# Patient Record
Sex: Male | Born: 1995 | Race: White | Hispanic: No | Marital: Single | Smoking: Never smoker
Health system: Southern US, Community
[De-identification: ages and names within clinical notes are randomized; demographics above are authoritative.]

---
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
## 2018-03-29 ENCOUNTER — Other Ambulatory Visit: Payer: Self-pay

## 2018-03-29 ENCOUNTER — Emergency Department (HOSPITAL_BASED_OUTPATIENT_CLINIC_OR_DEPARTMENT_OTHER)
Admission: EM | Admit: 2018-03-29 | Discharge: 2018-03-29 | Disposition: A | Discharge status: Home / Self Care | Payer: 59 | Attending: Emergency Medicine | Admitting: Emergency Medicine

## 2018-03-29 ENCOUNTER — Encounter (HOSPITAL_BASED_OUTPATIENT_CLINIC_OR_DEPARTMENT_OTHER): Payer: Self-pay

## 2018-03-29 DIAGNOSIS — Z91010 Allergy to peanuts: Secondary | ICD-10-CM | POA: Insufficient documentation

## 2018-03-29 DIAGNOSIS — S91012A Laceration without foreign body, left ankle, initial encounter: Secondary | ICD-10-CM | POA: Diagnosis present

## 2018-03-29 DIAGNOSIS — Z23 Encounter for immunization: Secondary | ICD-10-CM | POA: Insufficient documentation

## 2018-03-29 DIAGNOSIS — Y9389 Activity, other specified: Secondary | ICD-10-CM | POA: Diagnosis not present

## 2018-03-29 DIAGNOSIS — W268XXA Contact with other sharp object(s), not elsewhere classified, initial encounter: Secondary | ICD-10-CM | POA: Diagnosis not present

## 2018-03-29 DIAGNOSIS — Y999 Unspecified external cause status: Secondary | ICD-10-CM | POA: Diagnosis not present

## 2018-03-29 DIAGNOSIS — Y929 Unspecified place or not applicable: Secondary | ICD-10-CM | POA: Insufficient documentation

## 2018-03-29 MED ORDER — CLINDAMYCIN HCL 150 MG PO CAPS: 300.0000 mg | ORAL_CAPSULE | Freq: Four times a day (QID) | ORAL | 0 refills | Status: DC

## 2018-03-29 MED ORDER — LIDOCAINE-EPINEPHRINE (PF) 2 %-1:200000 IJ SOLN
10.0000 mL | Freq: Once | INTRAMUSCULAR | Status: AC
  Administered 2018-03-29: 10 mL
  Filled 2018-03-29 (×2): qty 10

## 2018-03-29 MED ORDER — TETANUS-DIPHTH-ACELL PERTUSSIS 5-2.5-18.5 LF-MCG/0.5 IM SUSP
0.5000 mL | Freq: Once | INTRAMUSCULAR | Status: AC
  Administered 2018-03-29: 0.5 mL via INTRAMUSCULAR
  Filled 2018-03-29: qty 0.5

## 2018-03-29 NOTE — Discharge Instructions (Signed)
Please read and follow all provided instructions.  Your diagnoses today include:  1. Laceration of left ankle, initial encounter     Tests performed today include:  Vital signs. See below for your results today.   Medications prescribed:   Clindamycin - antibiotic  You have been prescribed an antibiotic medicine: take the entire course of medicine even if you are feeling better. Stopping early can cause the antibiotic not to work.  Take any prescribed medications only as directed.   Home care instructions:  Follow any educational materials and wound care instructions contained in this packet.   Keep affected area above the level of your heart when possible to minimize swelling. Wash area gently twice a day with warm soapy water. Do not apply alcohol or hydrogen peroxide. Cover the area if it draining or weeping.   Follow-up instructions: Suture Removal: Return to the Emergency Department or see your primary care care doctor in 10 days for a recheck of your wound and removal of your sutures or staples.    Return instructions:  Return to the Emergency Department if you have:  Fever  Worsening pain  Worsening swelling of the wound  Pus draining from the wound  Redness of the skin that moves away from the wound, especially if it streaks away from the affected area   Any other emergent concerns  Your vital signs today were: BP 102/68 (BP Location: Right Arm)    Pulse 67    Temp 98.6 F (37 C) (Oral)    Resp 18    Ht 5\' 9"  (1.753 m)    Wt 68 kg (150 lb)    SpO2 99%    BMI 22.15 kg/m  If your blood pressure (BP) was elevated above 135/85 this visit, please have this repeated by your doctor within one month. --------------
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
## 2021-10-12 ENCOUNTER — Emergency Department (HOSPITAL_COMMUNITY): Payer: 59

## 2021-10-12 ENCOUNTER — Emergency Department (HOSPITAL_COMMUNITY)
Admission: EM | Admit: 2021-10-12 | Discharge: 2021-10-12 | Disposition: A | Discharge status: Home / Self Care | Payer: 59 | Attending: Emergency Medicine | Admitting: Emergency Medicine

## 2021-10-12 ENCOUNTER — Encounter (HOSPITAL_COMMUNITY): Payer: Self-pay

## 2021-10-12 ENCOUNTER — Other Ambulatory Visit: Payer: Self-pay

## 2021-10-12 DIAGNOSIS — R1084 Generalized abdominal pain: Secondary | ICD-10-CM | POA: Diagnosis not present

## 2021-10-12 DIAGNOSIS — Z91010 Allergy to peanuts: Secondary | ICD-10-CM | POA: Insufficient documentation

## 2021-10-12 DIAGNOSIS — Z20822 Contact with and (suspected) exposure to covid-19: Secondary | ICD-10-CM | POA: Insufficient documentation

## 2021-10-12 DIAGNOSIS — R112 Nausea with vomiting, unspecified: Secondary | ICD-10-CM | POA: Diagnosis not present

## 2021-10-12 DIAGNOSIS — R109 Unspecified abdominal pain: Secondary | ICD-10-CM | POA: Diagnosis present

## 2021-10-12 DIAGNOSIS — R630 Anorexia: Secondary | ICD-10-CM | POA: Diagnosis not present

## 2021-10-12 LAB — URINALYSIS, ROUTINE W REFLEX MICROSCOPIC
Bilirubin Urine: NEGATIVE
Glucose, UA: NEGATIVE mg/dL
Hgb urine dipstick: NEGATIVE
Ketones, ur: NEGATIVE mg/dL
Leukocytes,Ua: NEGATIVE
Nitrite: NEGATIVE
Protein, ur: NEGATIVE mg/dL
Specific Gravity, Urine: <1.005 — ABNORMAL LOW (ref 1.005–1.030)
pH: 6 (ref 5.0–8.0)

## 2021-10-12 LAB — CBC WITH DIFFERENTIAL/PLATELET
Abs Immature Granulocytes: 0.01 10*3/uL (ref 0.00–0.07)
Basophils Absolute: 0.1 10*3/uL (ref 0.0–0.1)
Basophils Relative: 1 %
Eosinophils Absolute: 0.7 10*3/uL — ABNORMAL HIGH (ref 0.0–0.5)
Eosinophils Relative: 9 %
HCT: 43.2 % (ref 39.0–52.0)
Hemoglobin: 15.7 g/dL (ref 13.0–17.0)
Immature Granulocytes: 0 %
Lymphocytes Relative: 29 %
Lymphs Abs: 2.3 10*3/uL (ref 0.7–4.0)
MCH: 31.7 pg (ref 26.0–34.0)
MCHC: 36.3 g/dL — ABNORMAL HIGH (ref 30.0–36.0)
MCV: 87.3 fL (ref 80.0–100.0)
Monocytes Absolute: 0.5 10*3/uL (ref 0.1–1.0)
Monocytes Relative: 6 %
Neutro Abs: 4.2 10*3/uL (ref 1.7–7.7)
Neutrophils Relative %: 55 %
Platelets: 296 10*3/uL (ref 150–400)
RBC: 4.95 MIL/uL (ref 4.22–5.81)
RDW: 11.9 % (ref 11.5–15.5)
WBC: 7.8 10*3/uL (ref 4.0–10.5)
nRBC: 0 % (ref 0.0–0.2)

## 2021-10-12 LAB — COMPREHENSIVE METABOLIC PANEL
ALT: 15 U/L (ref 0–44)
AST: 20 U/L (ref 15–41)
Albumin: 4.5 g/dL (ref 3.5–5.0)
Alkaline Phosphatase: 47 U/L (ref 38–126)
Anion gap: 7 (ref 5–15)
BUN: 12 mg/dL (ref 6–20)
CO2: 26 mmol/L (ref 22–32)
Calcium: 9.6 mg/dL (ref 8.9–10.3)
Chloride: 106 mmol/L (ref 98–111)
Creatinine, Ser: 1.12 mg/dL (ref 0.61–1.24)
GFR, Estimated: >60 mL/min (ref >60)
Glucose, Bld: 95 mg/dL (ref 70–99)
Potassium: 3.6 mmol/L (ref 3.5–5.1)
Sodium: 139 mmol/L (ref 135–145)
Total Bilirubin: 0.5 mg/dL (ref 0.3–1.2)
Total Protein: 7.4 g/dL (ref 6.5–8.1)

## 2021-10-12 LAB — RESP PANEL BY RT-PCR (FLU A&B, COVID) ARPGX2
Influenza A by PCR: NEGATIVE
Influenza B by PCR: NEGATIVE
SARS Coronavirus 2 by RT PCR: NEGATIVE

## 2021-10-12 LAB — LIPASE, BLOOD: Lipase: 32 U/L (ref 11–51)

## 2021-10-12 MED ORDER — IOHEXOL 300 MG/ML  SOLN
100.0000 mL | Freq: Once | INTRAMUSCULAR | Status: AC | PRN
  Administered 2021-10-12: 100 mL via INTRAVENOUS

## 2021-10-12 MED ORDER — FENTANYL CITRATE PF 50 MCG/ML IJ SOSY
50.0000 ug | PREFILLED_SYRINGE | Freq: Once | INTRAMUSCULAR | Status: AC
  Administered 2021-10-12: 50 ug via INTRAVENOUS
  Filled 2021-10-12: qty 1

## 2021-10-12 MED ORDER — SODIUM CHLORIDE (PF) 0.9 % IJ SOLN
INTRAMUSCULAR | Status: AC
  Filled 2021-10-12: qty 50

## 2021-10-12 MED ORDER — ONDANSETRON 4 MG PO TBDP: 4.0000 mg | ORAL_TABLET | Freq: Three times a day (TID) | ORAL | 0 refills | Status: DC | PRN

## 2021-10-12 MED ORDER — SODIUM CHLORIDE 0.9 % IV BOLUS
1000.0000 mL | Freq: Once | INTRAVENOUS | Status: AC
  Administered 2021-10-12: 1000 mL via INTRAVENOUS

## 2021-10-12 MED ORDER — ONDANSETRON HCL 4 MG/2ML IJ SOLN
4.0000 mg | Freq: Once | INTRAMUSCULAR | Status: AC
  Administered 2021-10-12: 4 mg via INTRAVENOUS
  Filled 2021-10-12: qty 2

## 2021-10-12 MED ORDER — ONDANSETRON 4 MG PO TBDP: 4.0000 mg | ORAL_TABLET | Freq: Three times a day (TID) | ORAL | 0 refills | Status: AC | PRN

## 2021-10-12 MED ORDER — DICYCLOMINE HCL 20 MG PO TABS: 20.0000 mg | ORAL_TABLET | Freq: Two times a day (BID) | ORAL | 0 refills | Status: DC

## 2021-10-12 MED ORDER — DICYCLOMINE HCL 20 MG PO TABS: 20.0000 mg | ORAL_TABLET | Freq: Two times a day (BID) | ORAL | 0 refills | Status: AC

## 2021-10-12 NOTE — ED Notes (Signed)
Pt aware of need for urine specimen. 

## 2021-10-12 NOTE — Discharge Instructions (Signed)
As discussed, your evaluation today has been largely reassuring.  But, it is important that you monitor your condition carefully, and do not hesitate to return to the ED if you develop new, or concerning changes in your condition. ? ?Otherwise, please follow-up with your physician for appropriate ongoing care. ? ?

## 2021-10-12 NOTE — ED Triage Notes (Signed)
Patient c/o severe abdominal pain since Saturday. Patient reports nausea and vomiting since Saturday. Patient has not taking medications to help with pain. Patient denies fever, headaches, and chills.

## 2021-10-12 NOTE — ED Provider Notes (Signed)
[facility] DEPT Provider Note   CSN: [id number] Arrival date & time: 10/12/21  0932     History  No chief complaint on file.   Christian Dunn is a 26 y.o. male.  HPI Patient is previously well young male presenting with his father who assists with the history.  He presents with 5 days of intermittent abdominal pain, currently the most severe.  Pain is left-sided, inferior, superior radiation, none on the right side.  There is associated nausea, vomiting, anorexia.  Last bowel movement was yesterday, unremarkable.  No history of abdominal surgery.     Home Medications Prior to Admission medications   Medication Sig Start Date End Date Taking? Authorizing Provider  clindamycin (CLEOCIN) 150 MG capsule Take 2 capsules (300 mg total) by mouth every 6 (six) hours. 03/29/18   Carlisle Cater, PA-C  dicyclomine (BENTYL) 20 MG tablet Take 1 tablet (20 mg total) by mouth 2 (two) times daily. 10/12/21   Carmin Muskrat, MD  ibuprofen (ADVIL,MOTRIN) 200 MG tablet Take 200 mg by mouth every 6 (six) hours as needed. For pain    [provider]  ondansetron (ZOFRAN-ODT) 4 MG disintegrating tablet Take 1 tablet (4 mg total) by mouth every 8 (eight) hours as needed for nausea or vomiting. 10/12/21   Carmin Muskrat, MD  Pediatric Multiple Vit-C-FA (PEDIATRIC MULTIVITAMIN) chewable tablet Chew 1 tablet by mouth daily.    [provider]      Allergies    Peanut-containing drug products and Penicillins cross reactors    Review of Systems   Review of Systems  Constitutional:        Per HPI, otherwise negative  HENT:         Per HPI, otherwise negative  Respiratory:         Per HPI, otherwise negative  Cardiovascular:        Per HPI, otherwise negative  Gastrointestinal:  Positive for abdominal pain, nausea and vomiting.  Endocrine:       Negative aside from HPI  Genitourinary:        Neg aside from HPI   Musculoskeletal:        Per HPI,  otherwise negative  Skin: Negative.   Neurological:  Negative for syncope.   Physical Exam Updated Vital Signs BP (!) 103/55    Pulse (!) 52    Temp 97.8 F (36.6 C) (Oral)    Resp 16    Ht 5\' 8"  (1.727 m)    Wt 61.2 kg    SpO2 96%    BMI 20.53 kg/m  Physical Exam Vitals and nursing note reviewed.  Constitutional:      General: He is not in acute distress.    Appearance: He is well-developed.  HENT:     Head: Normocephalic and atraumatic.  Eyes:     Conjunctiva/sclera: Conjunctivae normal.  Cardiovascular:     Rate and Rhythm: Normal rate and regular rhythm.  Pulmonary:     Effort: Pulmonary effort is normal. No respiratory distress.     Breath sounds: No stridor.  Abdominal:     General: There is no distension.     Tenderness: There is abdominal tenderness in the left upper quadrant and left lower quadrant. There is guarding.  Skin:    General: Skin is warm and dry.  Neurological:     Mental Status: He is alert and oriented to person, place, and time.    ED Results / Procedures / Treatments   Labs (  all labs ordered are listed, but only abnormal results are displayed) Labs Reviewed  CBC WITH DIFFERENTIAL/PLATELET - Abnormal; Notable for the following components:      Result Value   MCHC 36.3 (*)    Eosinophils Absolute 0.7 (*)    All other components within normal limits  URINALYSIS, ROUTINE W REFLEX MICROSCOPIC - Abnormal; Notable for the following components:   Specific Gravity, Urine <1.005 (*)    All other components within normal limits  RESP PANEL BY RT-PCR (FLU A&B, COVID) ARPGX2  COMPREHENSIVE METABOLIC PANEL  LIPASE, BLOOD    EKG None  Radiology CT Abdomen Pelvis W Contrast  Result Date: 10/12/2021 CLINICAL DATA:  Left lower quadrant abdominal pain with nausea and vomiting and anorexia for the past 4 days. EXAM: CT ABDOMEN AND PELVIS WITH CONTRAST TECHNIQUE: Multidetector CT imaging of the abdomen and pelvis was performed using the standard protocol  following bolus administration of intravenous contrast. RADIATION DOSE REDUCTION: This exam was performed according to the departmental dose-optimization program which includes automated exposure control, adjustment of the mA and/or kV according to patient size and/or use of iterative reconstruction technique. CONTRAST:  183mL OMNIPAQUE IOHEXOL 300 MG/ML  SOLN COMPARISON:  None. FINDINGS: Lower chest: Unremarkable. Hepatobiliary: No focal liver abnormality is seen. No gallstones, gallbladder wall thickening, or biliary dilatation. Pancreas: Unremarkable. No pancreatic ductal dilatation or surrounding inflammatory changes. Spleen: Normal in size without focal abnormality. Adrenals/Urinary Tract: Normal appearing adrenal glands. Tiny lower pole right renal cyst. Otherwise, normal appearing kidneys, ureters and urinary bladder. Stomach/Bowel: Stomach is within normal limits. Appendix appears normal. No evidence of bowel wall thickening, distention, or inflammatory changes. Vascular/Lymphatic: No significant vascular findings are present. No enlarged abdominal or pelvic lymph nodes. Reproductive: Prostate is unremarkable. Other: No abdominal wall hernia or abnormality. No abdominopelvic ascites. Musculoskeletal: Unremarkable bones. IMPRESSION: Normal examination. Electronically Signed   By: Claudie Revering M.D.   On: 10/12/2021 11:22    Procedures Procedures    Medications Ordered in ED Medications  sodium chloride (PF) 0.9 % injection (has no administration in time range)  sodium chloride 0.9 % bolus 1,000 mL (0 mLs Intravenous Stopped 10/12/21 1248)  fentaNYL (SUBLIMAZE) injection 50 mcg (50 mcg Intravenous Given 10/12/21 1105)  ondansetron (ZOFRAN) injection 4 mg (4 mg Intravenous Given 10/12/21 1104)  iohexol (OMNIPAQUE) 300 MG/ML solution 100 mL (100 mLs Intravenous Contrast Given 10/12/21 1109)   3:10 PM Reviewed exam patient is awake, alert, no distress, coming by his mother, symptoms have resolved. ED  Course/ Medical Decision Making/ A&P This patient presents to the ED for concern of abdominal pain, nausea, vomiting, this involves an extensive number of treatment options, and is a complaint that carries with it a high risk of complications and morbidity.  The differential diagnosis includes appendicitis, colitis, diverticulitis, intussusception, urinary tract dysfunction and/or kidney stone   Co morbidities that complicate the patient evaluation  None   Social Determinants of Health:  Use   Additional history obtained:  Additional history and/or information obtained from dad, mom External records from outside source obtained and reviewed including parents   Lab Tests:  I Ordered (or co-signed), and personally interpreted labs.  The pertinent results include: Reassuring labs   Imaging Studies ordered:  I ordered (or co-signed) imaging studies including CT scan abdomen pelvis I independently visualized and interpreted imaging which showed no notable findings abdomen pelvis I agree with the radiologist interpretation   Medicines ordered and prescription drug management:  I ordered medication including parenteral narcotics,  antiemetics, fluid resuscitation Reevaluation of the patient after these medicines showed that the patient improved   Reevaluation:  After the interventions noted above, I reevaluated the patient and found that they have :improved.  Asymptomatic on repeat evaluation   Dispostion:  After consideration of the diagnostic results and the patients response to treatment, I feel that the patent would benefit from discharge with close outpatient follow-up, initiation of antispasmodics, antiemetics.  We discussed possibilities including previously passed kidney stone, postinfectious spasm, renal colic, other possibilities.  Without ongoing symptoms, and with reassuring studies, patient, mother, comfortable with close outpatient follow-up.    Final Clinical  Impression(s) / ED Diagnoses Final diagnoses:  Generalized abdominal pain    Rx / DC Orders ED Discharge Orders          Ordered    dicyclomine (BENTYL) 20 MG tablet  2 times daily,   Status:  Discontinued        10/12/21 1445    ondansetron (ZOFRAN-ODT) 4 MG disintegrating tablet  Every 8 hours PRN,   Status:  Discontinued        10/12/21 1445    dicyclomine (BENTYL) 20 MG tablet  2 times daily        10/12/21 1454    ondansetron (ZOFRAN-ODT) 4 MG disintegrating tablet  Every 8 hours PRN        10/12/21 1454              Carmin Muskrat, MD 10/12/21 1512

## 2021-10-17 ENCOUNTER — Ambulatory Visit
Admission: EM | Admit: 2021-10-17 | Discharge: 2021-10-17 | Disposition: A | Discharge status: Home / Self Care | Payer: 59

## 2021-10-17 ENCOUNTER — Other Ambulatory Visit: Payer: Self-pay

## 2021-10-17 DIAGNOSIS — R112 Nausea with vomiting, unspecified: Secondary | ICD-10-CM

## 2021-10-17 NOTE — ED Triage Notes (Signed)
Patient c/o severe abdominal pain, nausea and vomiting since Saturday.

## 2021-10-17 NOTE — Discharge Instructions (Signed)
Please follow-up with primary care for this issue.

## 2021-10-17 NOTE — ED Provider Notes (Signed)
[facility]    CSN: [id number] Arrival date & time: 10/17/21  1503    HISTORY   Chief Complaint  Patient presents with   Abdominal Pain   Nausea   HPI Christian Dunn is a 26 y.o. male. Patient presents today complaining of severe abdominal pain, nausea and vomiting going on for the past 2 days.  Patient states he went to the emergency room on February 2 for the same complaint, which would be prior to this past Saturday.  During that visit, patient had a CT scan performed of his abdomen, urinalysis panel, complete blood count, lipase level and metabolic panel, all results were normal.  Patient endorses intermittent use of marijuana, states last use was a month ago.  Patient denies any use of any other illicit drugs.  Patient states today that he continues to have nausea and vomiting of food.  Denies bloody or bilious vomit.  Patient states he has been taking the nausea medication prescribed for him, Zofran, states it helps some.  Patient states he has a primary care provider but only sees her "sometimes".  The history is provided by the patient.    History reviewed. No pertinent past medical history. There are no problems to display for this patient.  History reviewed. No pertinent surgical history.  Home Medications    Prior to Admission medications   Medication Sig Start Date End Date Taking? Authorizing Provider  dicyclomine (BENTYL) 20 MG tablet Take 1 tablet (20 mg total) by mouth 2 (two) times daily. 10/12/21   Carmin Muskrat, MD  ibuprofen (ADVIL,MOTRIN) 200 MG tablet Take 200 mg by mouth every 6 (six) hours as needed. For pain    [provider]  ondansetron (ZOFRAN-ODT) 4 MG disintegrating tablet Take 1 tablet (4 mg total) by mouth every 8 (eight) hours as needed for nausea or vomiting. 10/12/21   Carmin Muskrat, MD    Family History History reviewed. No pertinent family history. Social History Social History   Tobacco Use   Smoking status:  Never   Smokeless tobacco: Never  Substance Use Topics   Alcohol use: Not Currently   Drug use: Never   Allergies   Peanut-containing drug products and Penicillins cross reactors  Review of Systems Review of Systems Pertinent findings noted in history of present illness.   Physical Exam Triage Vital Signs ED Triage Vitals  Enc Vitals Group     BP 07/07/21 0827 (!) 147/82     Pulse Rate 07/07/21 0827 72     Resp 07/07/21 0827 18     Temp 07/07/21 0827 98.3 F (36.8 C)     Temp Source 07/07/21 0827 Oral     SpO2 07/07/21 0827 98 %     Weight --      Height --      Head Circumference --      Peak Flow --      Pain Score 07/07/21 0826 5     Pain Loc --      Pain Edu? --      Excl. in [place]? --   No data found.  Updated Vital Signs BP 96/61 (BP Location: Right Arm)    Pulse 70    Temp 97.9 F (36.6 C) (Oral)    Resp 18    SpO2 95%   Physical Exam Vitals and nursing note reviewed.  Constitutional:      General: He is not in acute distress.    Appearance: Normal appearance. He is not  ill-appearing.  HENT:     Head: Normocephalic and atraumatic.  Eyes:     General: Lids are normal.        Right eye: No discharge.        Left eye: No discharge.     Extraocular Movements: Extraocular movements intact.     Conjunctiva/sclera: Conjunctivae normal.     Right eye: Right conjunctiva is not injected.     Left eye: Left conjunctiva is not injected.  Neck:     Trachea: Trachea and phonation normal.  Cardiovascular:     Rate and Rhythm: Normal rate and regular rhythm.     Pulses: Normal pulses.     Heart sounds: Normal heart sounds. No murmur heard.   No friction rub. No gallop.  Pulmonary:     Effort: Pulmonary effort is normal. No accessory muscle usage, prolonged expiration or respiratory distress.     Breath sounds: Normal breath sounds. No stridor, decreased air movement or transmitted upper airway sounds. No decreased breath sounds, wheezing, rhonchi or rales.  Chest:      Chest wall: No tenderness.  Abdominal:     General: Abdomen is flat. Bowel sounds are normal. There is no distension.     Palpations: Abdomen is soft.     Tenderness: There is no abdominal tenderness. There is no right CVA tenderness or left CVA tenderness.     Hernia: No hernia is present.  Musculoskeletal:        General: Normal range of motion.     Cervical back: Normal range of motion and neck supple. Normal range of motion.  Lymphadenopathy:     Cervical: No cervical adenopathy.  Skin:    General: Skin is warm and dry.     Findings: No erythema or rash.  Neurological:     General: No focal deficit present.     Mental Status: He is alert and oriented to person, place, and time.  Psychiatric:        Mood and Affect: Mood normal.        Behavior: Behavior normal.    Visual Acuity Right Eye Distance:   Left Eye Distance:   Bilateral Distance:    Right Eye Near:   Left Eye Near:    Bilateral Near:     UC Couse / Diagnostics / Procedures:    EKG  Radiology No results found.  Procedures Procedures (including critical care time)  UC Diagnoses / Final Clinical Impressions(s)   I have reviewed the triage vital signs and the nursing notes.  Pertinent labs & imaging results that were available during my care of the patient were reviewed by me and considered in my medical decision making (see chart for details).    Final diagnoses:  Nausea and vomiting, unspecified vomiting type   Patient is well-appearing on exam today.  Vital signs are normal.  Patient had a complete work-up for GI issues 5 days ago, all results were normal.  Patient advised to follow-up with primary care.  ED Prescriptions   None    PDMP not reviewed this encounter.  Pending results:  Labs Reviewed - No data to display  Medications Ordered in UC: Medications - No data to display  Disposition Upon Discharge:  Condition: stable for discharge home Home: take medications as prescribed;  routine discharge instructions as discussed; follow up as advised.  The patient will follow up with their current PCP if and as advised. If the patient does not currently have a PCP we  will assist them in obtaining one.   The patient may need specialty follow up if the symptoms continue, in spite of conservative treatment and management, for further workup, evaluation, consultation and treatment as clinically indicated and appropriate.  Patient/parent/caregiver verbalized understanding and agreement of plan as discussed.  All questions were addressed during visit.  Please see discharge instructions below for further details of plan.  Discharge Instructions:   Discharge Instructions      Please follow-up with primary care for this issue.      This office note has been dictated using Museum/gallery curator.  Unfortunately, and despite my best efforts, this method of dictation can sometimes lead to occasional typographical or grammatical errors.  I apologize in advance if this occurs.     Lynden Oxford Scales, PA-C 10/17/21 1557

## 2021-12-11 ENCOUNTER — Encounter: Payer: Self-pay | Admitting: Internal Medicine

## 2022-01-01 ENCOUNTER — Ambulatory Visit: Payer: 59 | Admitting: Internal Medicine

## 2022-09-19 IMAGING — CT CT ABD-PELV W/ CM
2 of 4 series · 15 of 46 positions shown, 17 images · IV contrast (agent unspecified)
Comparison: None.

CLINICAL DATA: Left lower quadrant abdominal pain with nausea and
vomiting and anorexia for the past 4 days.

EXAM:
CT ABDOMEN AND PELVIS WITH CONTRAST
TECHNIQUE: Multidetector CT imaging of the abdomen and pelvis was performed
using the standard protocol following bolus administration of
intravenous contrast.

[Series 2: axial st · axial · 0.68mm/px · z∈[+1157,+1552]mm · 12 of 89 slices shown, 14 images]
[im 5/89  soft-tissue]
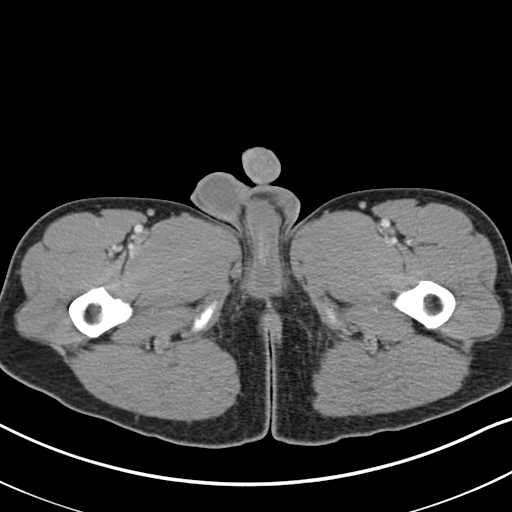
[im 5/89  bone]
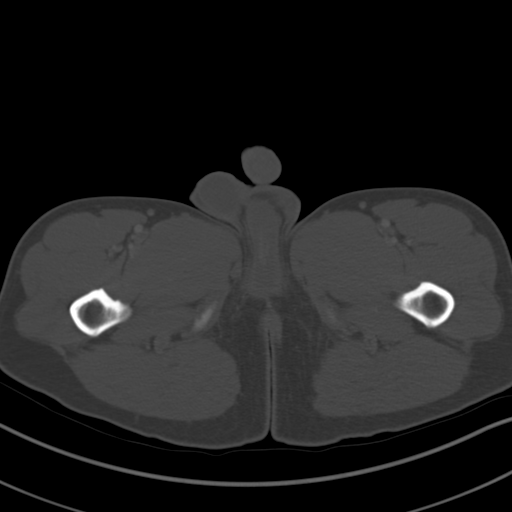
[im 14/89  soft-tissue]
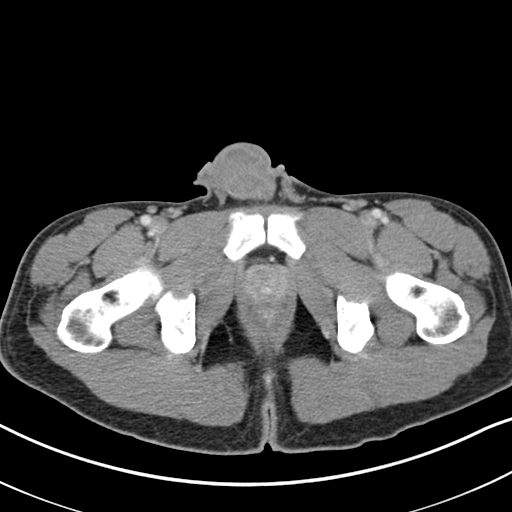
[im 19/89  soft-tissue]
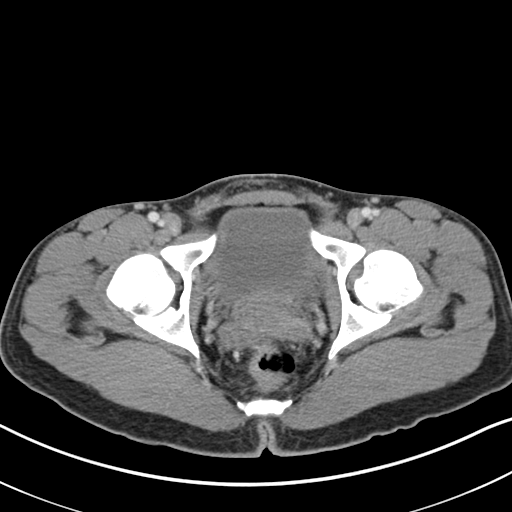
[im 28/89  soft-tissue]
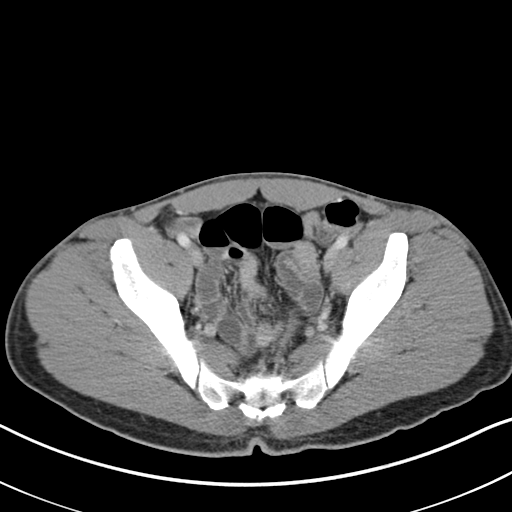
[im 33/89  soft-tissue]
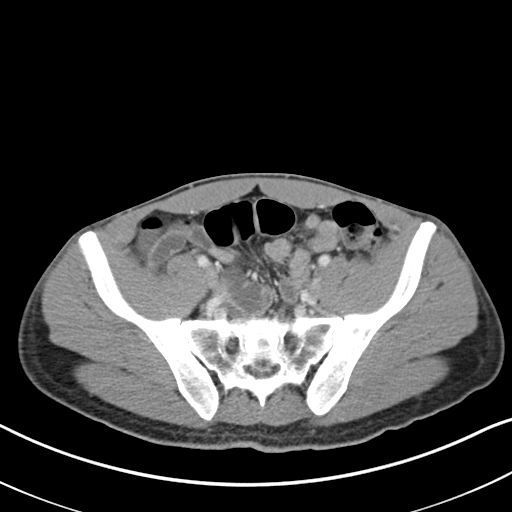
[im 42/89  soft-tissue]
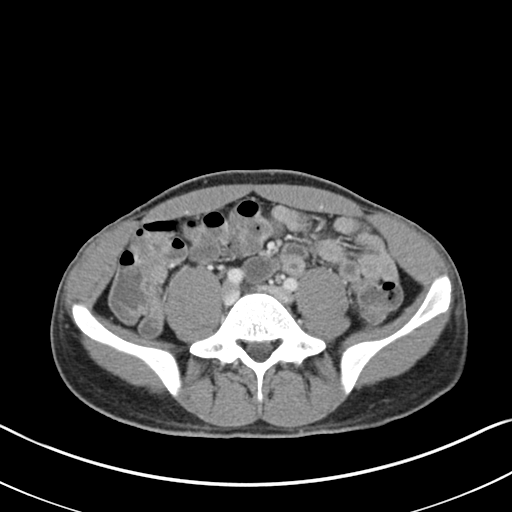
[im 47/89  soft-tissue]
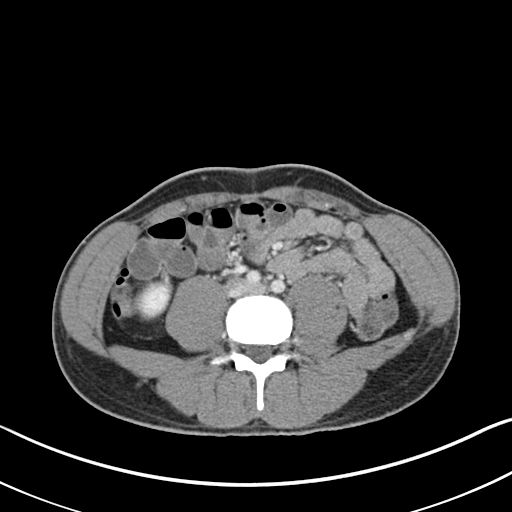
[im 56/89  soft-tissue]
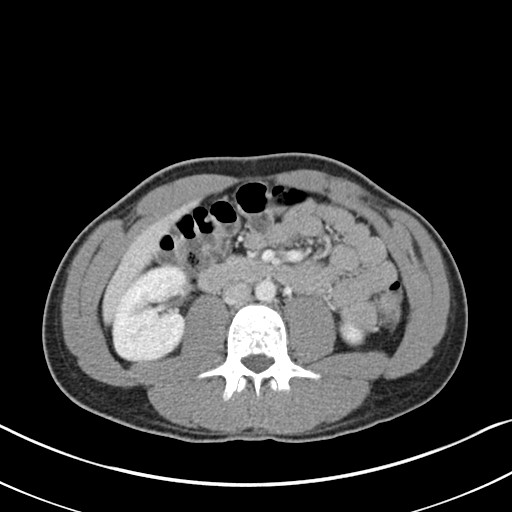
[im 61/89  soft-tissue]
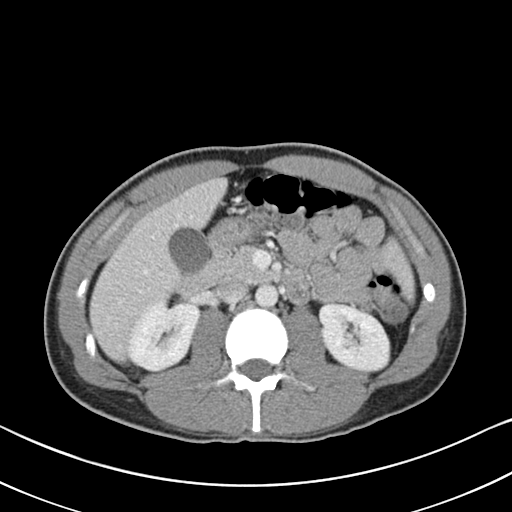
[im 61/89  bone]
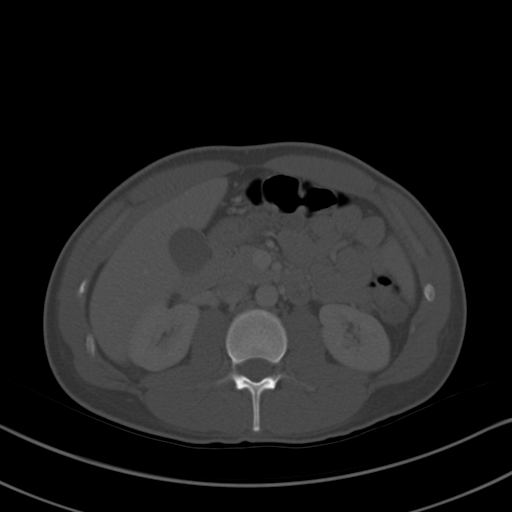
[im 70/89  soft-tissue]
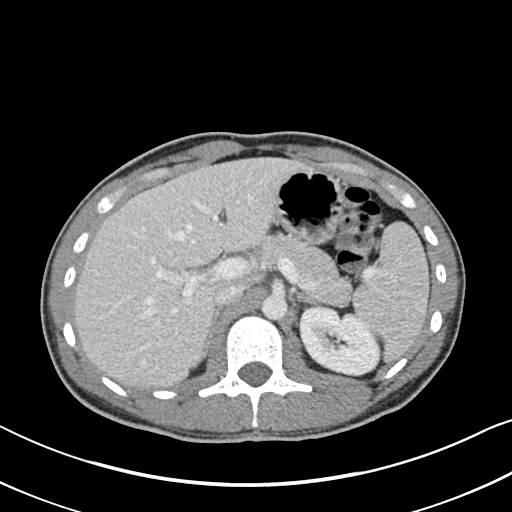
[im 75/89  soft-tissue]
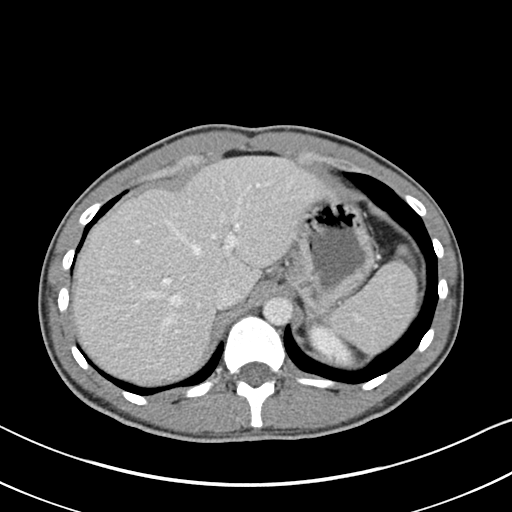
[im 84/89  soft-tissue]
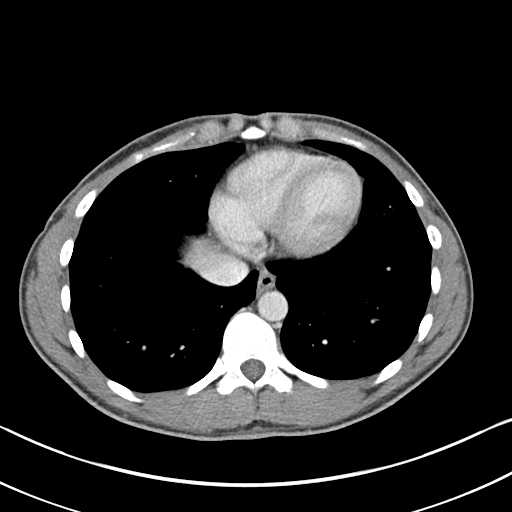

[Series 5: coronal st · coronal · 0.59mm/px · 3 of 122 slices shown]
[im 41/122  soft-tissue]
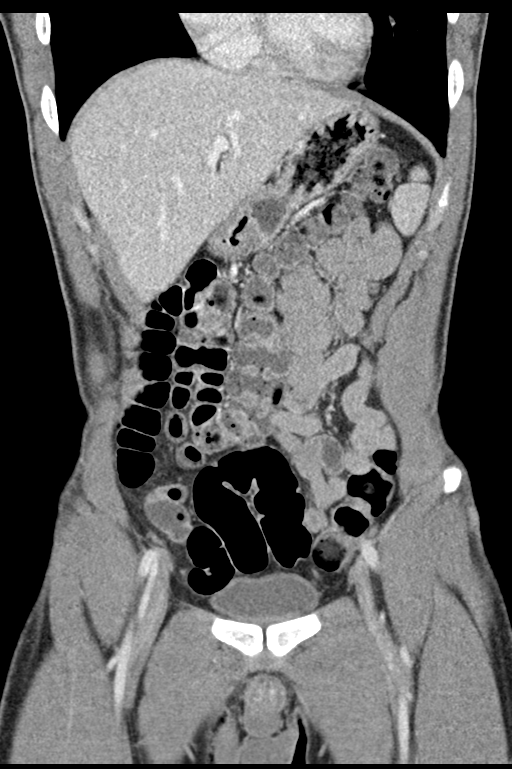
[im 54/122  soft-tissue]
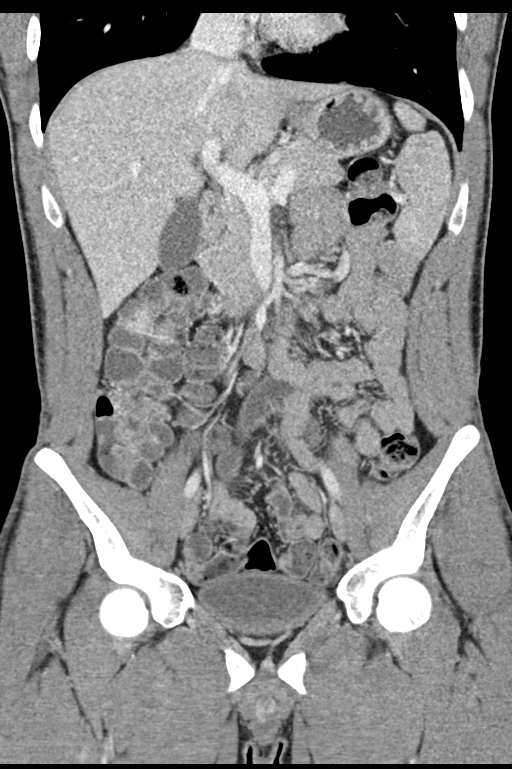
[im 68/122  soft-tissue]
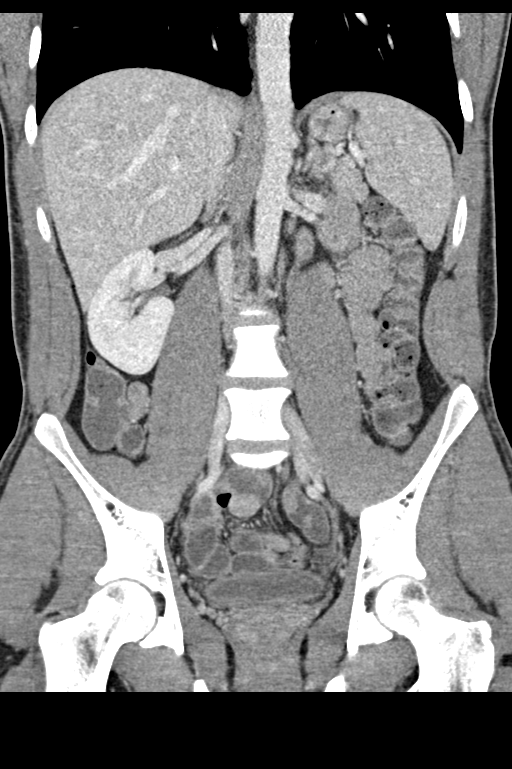

[15 of 46 positions shown; findings below may reference images not displayed]

RADIATION DOSE REDUCTION: This exam was performed according to the
departmental dose-optimization program which includes automated
exposure control, adjustment of the mA and/or kV according to
patient size and/or use of iterative reconstruction technique.

CONTRAST:  100mL OMNIPAQUE IOHEXOL 300 MG/ML  SOLN
FINDINGS: Lower chest: Unremarkable.

Hepatobiliary: No focal liver abnormality is seen. No gallstones,
gallbladder wall thickening, or biliary dilatation.

Pancreas: Unremarkable. No pancreatic ductal dilatation or
surrounding inflammatory changes.

Spleen: Normal in size without focal abnormality.

Adrenals/Urinary Tract: Normal appearing adrenal glands. Tiny lower
pole right renal cyst. Otherwise, normal appearing kidneys, ureters
and urinary bladder.

Stomach/Bowel: Stomach is within normal limits. Appendix appears
normal. No evidence of bowel wall thickening, distention, or
inflammatory changes.

Vascular/Lymphatic: No significant vascular findings are present. No
enlarged abdominal or pelvic lymph nodes.

Reproductive: Prostate is unremarkable.

Other: No abdominal wall hernia or abnormality. No abdominopelvic
ascites.

Musculoskeletal: Unremarkable bones.
IMPRESSION: Normal examination.
